# Patient Record
Sex: Female | Born: 1975 | Race: White | Hispanic: No | Marital: Married
Health system: Southern US, Community
[De-identification: ages and names within clinical notes are randomized; demographics above are authoritative.]

---

## 2021-05-11 ENCOUNTER — Emergency Department (HOSPITAL_COMMUNITY): Payer: BLUE CROSS/BLUE SHIELD

## 2021-05-11 ENCOUNTER — Other Ambulatory Visit: Payer: Self-pay

## 2021-05-11 ENCOUNTER — Encounter (HOSPITAL_COMMUNITY): Payer: Self-pay | Admitting: Emergency Medicine

## 2021-05-11 ENCOUNTER — Emergency Department (HOSPITAL_COMMUNITY)
Admission: EM | Admit: 2021-05-11 | Discharge: 2021-05-11 | Disposition: A | Discharge status: Home / Self Care | Payer: BLUE CROSS/BLUE SHIELD | Attending: Emergency Medicine | Admitting: Emergency Medicine

## 2021-05-11 DIAGNOSIS — W19XXXA Unspecified fall, initial encounter: Secondary | ICD-10-CM

## 2021-05-11 DIAGNOSIS — W1812XA Fall from or off toilet with subsequent striking against object, initial encounter: Secondary | ICD-10-CM | POA: Diagnosis not present

## 2021-05-11 DIAGNOSIS — Y908 Blood alcohol level of 240 mg/100 ml or more: Secondary | ICD-10-CM | POA: Insufficient documentation

## 2021-05-11 DIAGNOSIS — R911 Solitary pulmonary nodule: Secondary | ICD-10-CM | POA: Diagnosis not present

## 2021-05-11 DIAGNOSIS — S0990XA Unspecified injury of head, initial encounter: Secondary | ICD-10-CM | POA: Diagnosis not present

## 2021-05-11 DIAGNOSIS — Z79899 Other long term (current) drug therapy: Secondary | ICD-10-CM | POA: Diagnosis not present

## 2021-05-11 LAB — RAPID URINE DRUG SCREEN, HOSP PERFORMED
Amphetamines: NOT DETECTED
Barbiturates: NOT DETECTED
Benzodiazepines: NOT DETECTED
Cocaine: NOT DETECTED
Opiates: NOT DETECTED
Tetrahydrocannabinol: NOT DETECTED

## 2021-05-11 LAB — CBC WITH DIFFERENTIAL/PLATELET
Abs Immature Granulocytes: 0.06 10*3/uL (ref 0.00–0.07)
Basophils Absolute: 0.1 10*3/uL (ref 0.0–0.1)
Basophils Relative: 1 %
Eosinophils Absolute: 0.2 10*3/uL (ref 0.0–0.5)
Eosinophils Relative: 2 %
HCT: 47.1 % — ABNORMAL HIGH (ref 36.0–46.0)
Hemoglobin: 16 g/dL — ABNORMAL HIGH (ref 12.0–15.0)
Immature Granulocytes: 1 %
Lymphocytes Relative: 39 %
Lymphs Abs: 4.1 10*3/uL — ABNORMAL HIGH (ref 0.7–4.0)
MCH: 32.7 pg (ref 26.0–34.0)
MCHC: 34 g/dL (ref 30.0–36.0)
MCV: 96.3 fL (ref 80.0–100.0)
Monocytes Absolute: 0.5 10*3/uL (ref 0.1–1.0)
Monocytes Relative: 5 %
Neutro Abs: 5.6 10*3/uL (ref 1.7–7.7)
Neutrophils Relative %: 52 %
Platelets: 287 10*3/uL (ref 150–400)
RBC: 4.89 MIL/uL (ref 3.87–5.11)
RDW: 12 % (ref 11.5–15.5)
WBC: 10.6 10*3/uL — ABNORMAL HIGH (ref 4.0–10.5)
nRBC: 0 % (ref 0.0–0.2)

## 2021-05-11 LAB — URINALYSIS, ROUTINE W REFLEX MICROSCOPIC
Bilirubin Urine: NEGATIVE
Glucose, UA: NEGATIVE mg/dL
Ketones, ur: NEGATIVE mg/dL
Leukocytes,Ua: NEGATIVE
Nitrite: NEGATIVE
Protein, ur: NEGATIVE mg/dL
Specific Gravity, Urine: 1.005 (ref 1.005–1.030)
pH: 5 (ref 5.0–8.0)

## 2021-05-11 LAB — COMPREHENSIVE METABOLIC PANEL
ALT: 24 U/L (ref 0–44)
AST: 16 U/L (ref 15–41)
Albumin: 4.4 g/dL (ref 3.5–5.0)
Alkaline Phosphatase: 76 U/L (ref 38–126)
Anion gap: 7 (ref 5–15)
BUN: 10 mg/dL (ref 6–20)
CO2: 23 mmol/L (ref 22–32)
Calcium: 9.5 mg/dL (ref 8.9–10.3)
Chloride: 110 mmol/L (ref 98–111)
Creatinine, Ser: 0.78 mg/dL (ref 0.44–1.00)
GFR, Estimated: >60 mL/min (ref >60)
Glucose, Bld: 116 mg/dL — ABNORMAL HIGH (ref 70–99)
Potassium: 4.2 mmol/L (ref 3.5–5.1)
Sodium: 140 mmol/L (ref 135–145)
Total Bilirubin: 0.4 mg/dL (ref 0.3–1.2)
Total Protein: 7.4 g/dL (ref 6.5–8.1)

## 2021-05-11 LAB — PREGNANCY, URINE: Preg Test, Ur: NEGATIVE

## 2021-05-11 LAB — ETHANOL: Alcohol, Ethyl (B): 254 mg/dL — ABNORMAL HIGH (ref <10)

## 2021-05-11 NOTE — Discharge Instructions (Addendum)
1. Medications: usual home medications 2. Treatment: rest, drink plenty of fluids,  3. Follow Up: Please followup with your primary doctor in 2-3 days for discussion of your diagnoses and further evaluation after today's visit; if you do not have a primary care doctor use the resource guide provided to find one; Please return to the ER for new or worsening symptoms  

## 2021-05-11 NOTE — ED Notes (Signed)
Pt lowered herself to lie in the floor.  Pt placed in recliner and instructed to not lie down on the floor.

## 2021-05-11 NOTE — ED Provider Notes (Signed)
[facility] EMERGENCY DEPARTMENT Provider Note   CSN: [id number] Arrival date & time: 05/11/21  0120     History Chief Complaint  Patient presents with   Judith Williamson is a 45 y.o. female presents to the emergency department after syncopal episode while at a Christmas Party. Patient reports she went to the bathroom and when she came out she had a syncopal episode.  Pt states she had 2 glasses of wine at the party and ate a small amount of food.  Per EMS patient fell off the toilet and did hit her head.  They report she was very anxious upon their arrival.  Has been alert and oriented throughout transport.  Patient denies history of CVA or blood thinner usage.  Does have a history of migraine headaches but states today's headache feels different.  Denies vision changes, numbness or tingling.    The history is provided by the patient and medical records. No language interpreter was used.      History reviewed. No pertinent past medical history.  There are no problems to display for this patient.    OB History   No obstetric history on file.     No family history on file.     Home Medications Prior to Admission medications   Not on File    Allergies    Patient has no known allergies.  Review of Systems   Review of Systems  Constitutional:  Negative for appetite change, diaphoresis, fatigue, fever and unexpected weight change.  HENT:  Negative for mouth sores.   Eyes:  Negative for visual disturbance.  Respiratory:  Negative for cough, chest tightness, shortness of breath and wheezing.   Cardiovascular:  Negative for chest pain.  Gastrointestinal:  Negative for abdominal pain, constipation, diarrhea, nausea and vomiting.  Endocrine: Negative for polydipsia, polyphagia and polyuria.  Genitourinary:  Negative for dysuria, frequency, hematuria and urgency.  Musculoskeletal:  Negative for back pain and neck stiffness.  Skin:  Negative for rash.   Allergic/Immunologic: Negative for immunocompromised state.  Neurological:  Positive for syncope and headaches. Negative for light-headedness.  Hematological:  Does not bruise/bleed easily.  Psychiatric/Behavioral:  Negative for sleep disturbance. The patient is not nervous/anxious.    Physical Exam Updated Vital Signs BP (!) 148/97 (BP Location: Left Arm)   Pulse 74   Temp 98 F (36.7 C) (Oral)   Resp 17   SpO2 98%   Physical Exam Vitals and nursing note reviewed.  Constitutional:      General: She is not in acute distress.    Appearance: She is not diaphoretic.  HENT:     Head: Normocephalic.  Eyes:     General: No scleral icterus.    Conjunctiva/sclera: Conjunctivae normal.  Cardiovascular:     Rate and Rhythm: Normal rate and regular rhythm.     Pulses: Normal pulses.          Radial pulses are 2+ on the right side and 2+ on the left side.  Pulmonary:     Effort: No tachypnea, accessory muscle usage, prolonged expiration, respiratory distress or retractions.     Breath sounds: No stridor.     Comments: Equal chest rise. No increased work of breathing. Abdominal:     General: There is no distension.     Palpations: Abdomen is soft.     Tenderness: There is no abdominal tenderness. There is no guarding or rebound.  Musculoskeletal:     Cervical back: Normal  range of motion.     Comments: Moves all extremities equally and without difficulty.  Skin:    General: Skin is warm and dry.     Capillary Refill: Capillary refill takes less than 2 seconds.  Neurological:     Mental Status: She is alert.     GCS: GCS eye subscore is 4. GCS verbal subscore is 5. GCS motor subscore is 6.     Comments: Speech is clear and goal oriented. Pt A&O x4 at this time. Ambulatory with steady gait.   Psychiatric:        Mood and Affect: Mood normal.    ED Results / Procedures / Treatments   Labs (all labs ordered are listed, but only abnormal results are displayed) Labs Reviewed   CBC WITH DIFFERENTIAL/PLATELET - Abnormal; Notable for the following components:      Result Value   WBC 10.6 (*)    Hemoglobin 16.0 (*)    HCT 47.1 (*)    Lymphs Abs 4.1 (*)    All other components within normal limits  COMPREHENSIVE METABOLIC PANEL - Abnormal; Notable for the following components:   Glucose, Bld 116 (*)    All other components within normal limits  URINALYSIS, ROUTINE W REFLEX MICROSCOPIC - Abnormal; Notable for the following components:   Color, Urine STRAW (*)    Hgb urine dipstick SMALL (*)    Bacteria, UA RARE (*)    All other components within normal limits  ETHANOL - Abnormal; Notable for the following components:   Alcohol, Ethyl (B) 254 (*)    All other components within normal limits  RAPID URINE DRUG SCREEN, HOSP PERFORMED  PREGNANCY, URINE    Radiology CT HEAD WO CONTRAST (5MM)  Result Date: 05/11/2021 CLINICAL DATA:  Head trauma, neck trauma. EXAM: CT HEAD WITHOUT CONTRAST CT CERVICAL SPINE WITHOUT CONTRAST TECHNIQUE: Multidetector CT imaging of the head and cervical spine was performed following the standard protocol without intravenous contrast. Multiplanar CT image reconstructions of the cervical spine were also generated. COMPARISON:  None. FINDINGS: CT HEAD FINDINGS Brain: No acute intracranial hemorrhage, midline shift or mass effect. No extra-axial fluid collection. Gray-white matter differentiation is within normal limits and there is no hydrocephalus. Vascular: No hyperdense vessel or unexpected calcification. Skull: Normal. Negative for fracture or focal lesion. Sinuses/Orbits: No acute finding. Other: None. CT CERVICAL SPINE FINDINGS Alignment: Normal. Skull base and vertebrae: No acute fracture. No primary bone lesion or focal pathologic process. Soft tissues and spinal canal: No prevertebral fluid or swelling. No visible canal hematoma. Disc levels: No significant intervertebral disc space narrowing. No significant spinal canal or neural  foraminal stenosis. Upper chest: A 7 mm nodular opacity is present in the right upper lobe, axial image 94. Other: None. IMPRESSION: 1. No acute intracranial process. 2. No fracture or subluxation in the cervical spine. 3. Right upper lobe pulmonary nodule. Non-contrast chest CT at 6-12 months is recommended. If the nodule is stable at time of repeat CT, then future CT at 18-24 months (from today's scan) is considered optional for low-risk patients, but is recommended for high-risk patients. This recommendation follows the consensus statement: Guidelines for Management of Incidental Pulmonary Nodules Detected on CT Images: From the Fleischner Society 2017; Radiology 2017; 284:228-243. Electronically Signed   By: Brett Fairy M.D.   On: 05/11/2021 03:12   CT Cervical Spine Wo Contrast  Result Date: 05/11/2021 CLINICAL DATA:  Head trauma, neck trauma. EXAM: CT HEAD WITHOUT CONTRAST CT CERVICAL SPINE WITHOUT CONTRAST TECHNIQUE:  Multidetector CT imaging of the head and cervical spine was performed following the standard protocol without intravenous contrast. Multiplanar CT image reconstructions of the cervical spine were also generated. COMPARISON:  None. FINDINGS: CT HEAD FINDINGS Brain: No acute intracranial hemorrhage, midline shift or mass effect. No extra-axial fluid collection. Gray-white matter differentiation is within normal limits and there is no hydrocephalus. Vascular: No hyperdense vessel or unexpected calcification. Skull: Normal. Negative for fracture or focal lesion. Sinuses/Orbits: No acute finding. Other: None. CT CERVICAL SPINE FINDINGS Alignment: Normal. Skull base and vertebrae: No acute fracture. No primary bone lesion or focal pathologic process. Soft tissues and spinal canal: No prevertebral fluid or swelling. No visible canal hematoma. Disc levels: No significant intervertebral disc space narrowing. No significant spinal canal or neural foraminal stenosis. Upper chest: A 7 mm nodular  opacity is present in the right upper lobe, axial image 94. Other: None. IMPRESSION: 1. No acute intracranial process. 2. No fracture or subluxation in the cervical spine. 3. Right upper lobe pulmonary nodule. Non-contrast chest CT at 6-12 months is recommended. If the nodule is stable at time of repeat CT, then future CT at 18-24 months (from today's scan) is considered optional for low-risk patients, but is recommended for high-risk patients. This recommendation follows the consensus statement: Guidelines for Management of Incidental Pulmonary Nodules Detected on CT Images: From the Fleischner Society 2017; Radiology 2017; 284:228-243. Electronically Signed   By: Brett Fairy M.D.   On: 05/11/2021 03:12    Procedures Procedures   Medications Ordered in ED Medications - No data to display  ED Course  I have reviewed the triage vital signs and the nursing notes.  Pertinent labs & imaging results that were available during my care of the patient were reviewed by me and considered in my medical decision making (see chart for details).    MDM Rules/Calculators/A&P                           Pt presents via EMS after syncope.  EtOH on board.  Pt with unsteady gait in the ED.  C/o severe headache, but grossly normal neuro exam at triage.  Work-up ordered.   CT head/neck without traumatic injury or ICH.  Labs reassuring.  Pt with EtOH level >250.  Will allow patient to metabolize.   5:41 AM Pt awake and alert.  Oriented x4. She is ambulatory with steady gait and wishes to go home.  Reports her headache is improved, but not resolved.  No vomiting here in the ED.  BP has improved.   BP 113/87 (BP Location: Right Arm)   Pulse (!) 110   Temp 98.9 F (37.2 C) (Oral)   Resp 17   SpO2 97%   Discussed findings with patient including lung nodule and need for close f/u. Pt states understanding and is in agreement with the plan.    Final Clinical Impression(s) / ED Diagnoses Final diagnoses:   Pulmonary nodule  Blood alcohol level of 240 mg/100 ml or more  Fall, initial encounter    Rx / DC Orders ED Discharge Orders     None        Ginna Schuur, Gwenlyn Perking 05/11/21 [place], MD 05/11/21 [phone]

## 2021-05-11 NOTE — ED Notes (Signed)
Pt ambulatory in triage area, only c/o HA.

## 2021-05-11 NOTE — ED Provider Notes (Signed)
Emergency Medicine Provider Triage Evaluation Note  Judith Williamson , a 45 y.o. female  was evaluated in triage.  Pt complains of to be while at a Christmas party.  Patient reports she went to the bathroom and when she came out she had a syncopal episode.  Per EMS patient fell off the toilet and did hit her head.  They report she was very anxious upon their arrival.  Has been alert and oriented throughout transport.  Patient denies history of CVA or blood thinner usage.  Does have a history of migraine headaches but states today's headache feels different.  Denies vision changes, numbness or tingling.  Review of Systems  Positive: Headache, syncope Negative: Numbness, tingling, weakness, vision changes  Physical Exam  BP (!) 148/97 (BP Location: Left Arm)   Pulse 74   Temp 98 F (36.7 C) (Oral)   Resp 17   SpO2 98%  Gen:   Awake, no distress, tearful Resp:  Normal effort  MSK:   Moves extremities without difficulty  Other:  Ataxic gait  Medical Decision Making  Medically screening exam initiated at 1:43 AM.  Appropriate orders placed.  Judith Williamson was informed that the remainder of the evaluation will be completed by another provider, this initial triage assessment does not replace that evaluation, and the importance of remaining in the ED until their evaluation is complete.  EtOH, syncope, head injury.  Work-up pending.   Judith Williamson, Judith Williamson 05/11/21 0143    Judith Conn, MD 05/11/21 [phone]

## 2021-05-11 NOTE — ED Triage Notes (Signed)
Per EMS pt was at a Christmas party and "passed out" hitting her head.  Pt tearful at time of triage.

## 2022-12-07 IMAGING — CT CT CERVICAL SPINE W/O CM
4 series · 15 of 35 positions shown, 18 images · non-contrast
Comparison: None.

CLINICAL DATA: Head trauma, neck trauma.

EXAM:
CT HEAD WITHOUT CONTRAST
CT CERVICAL SPINE WITHOUT CONTRAST
TECHNIQUE: Multidetector CT imaging of the head and cervical spine was
performed following the standard protocol without intravenous
contrast. Multiplanar CT image reconstructions of the cervical spine
were also generated.

[Series 4: orthogonal axials · axial · 0.21mm/px · z∈[-279,-171]mm · 5 of 98 slices shown, 7 images]
[im 17/98  soft-tissue]
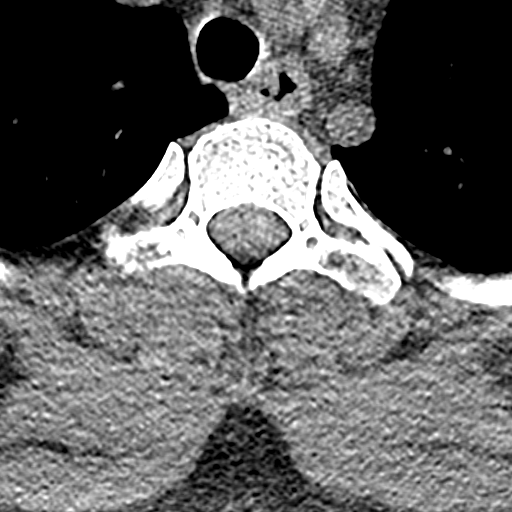
[im 17/98  bone]
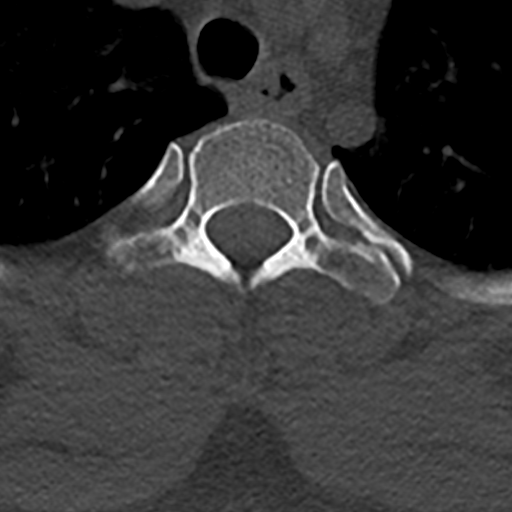
[im 33/98  bone]
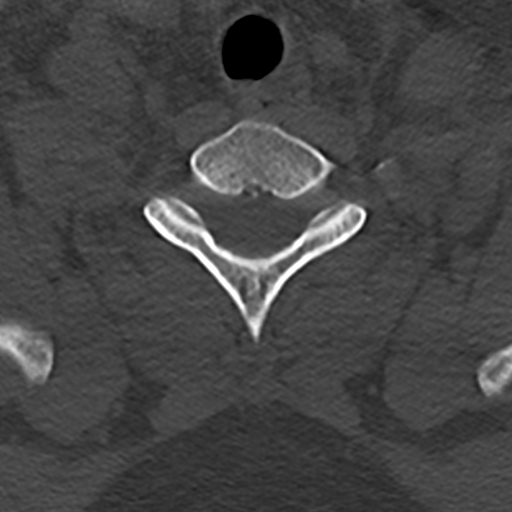
[im 49/98  bone]
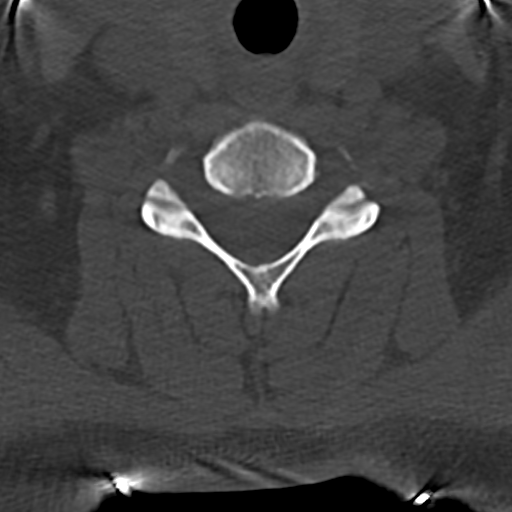
[im 65/98  bone]
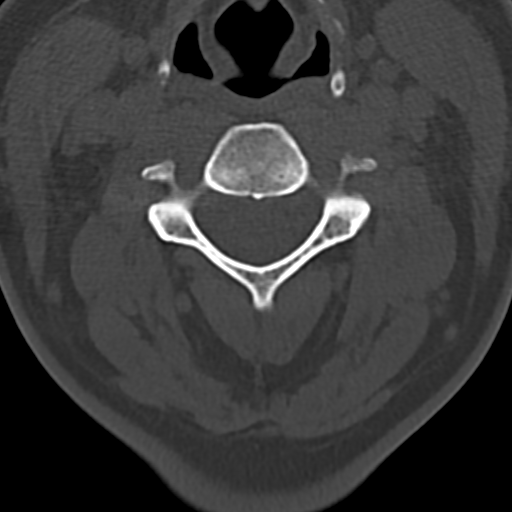
[im 81/98  soft-tissue]
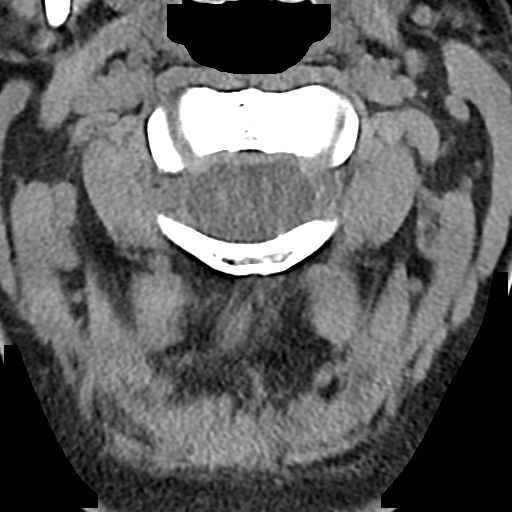
[im 81/98  bone]
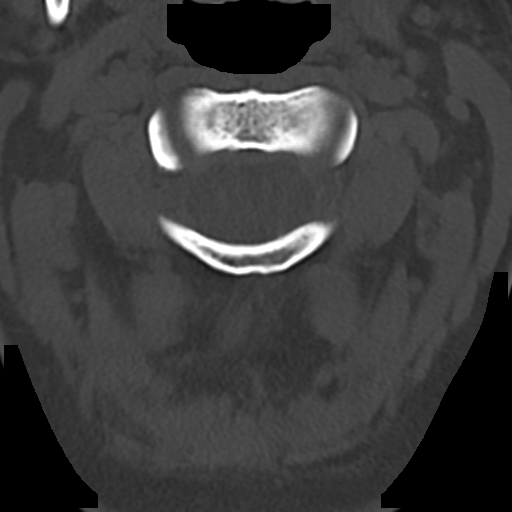

[Series 6: c spine soft · axial · 0.39mm/px · z∈[-268,-236]mm · 2 of 98 slices shown]
[im 17/98  soft-tissue]
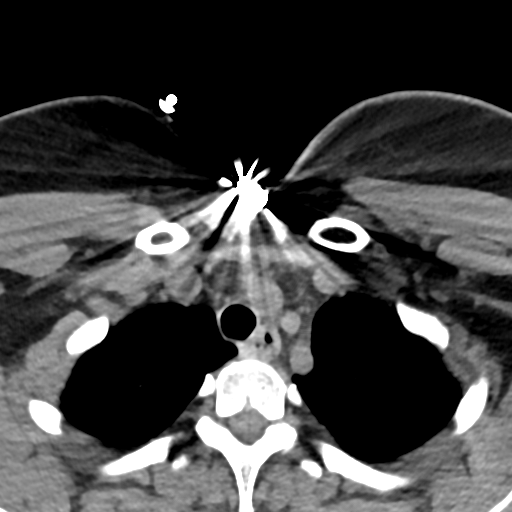
[im 33/98  soft-tissue]
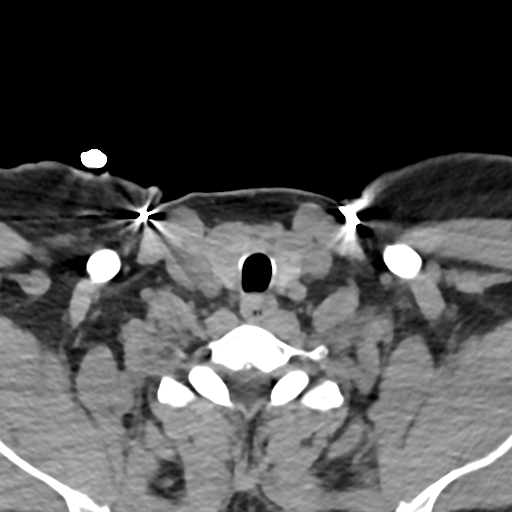

[Series 9: sag bone · sagittal · 0.34mm/px · 5 of 226 slices shown, 6 images]
[im 76/226  bone]
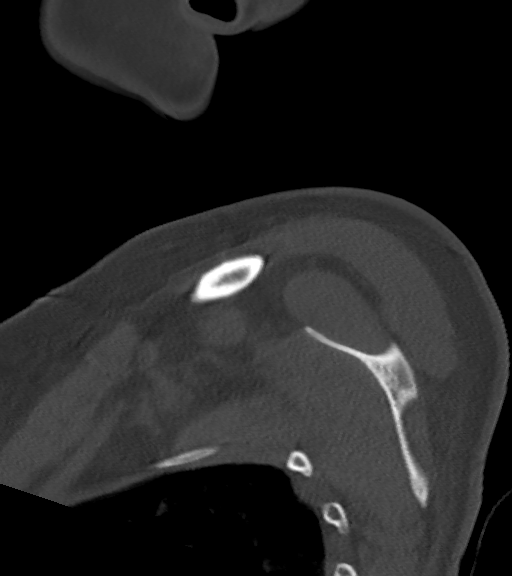
[im 94/226  bone]
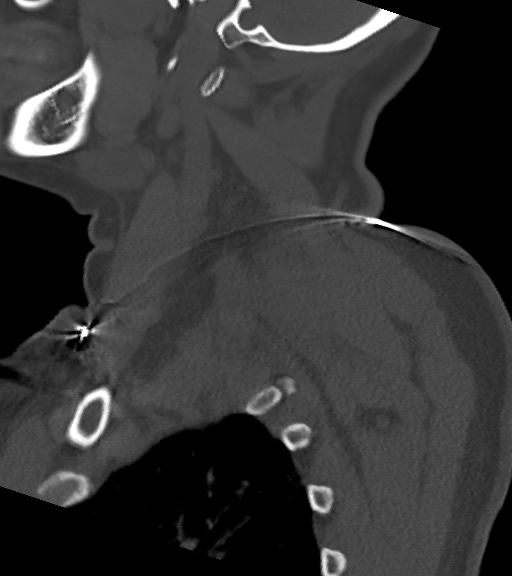
[im 113/226  soft-tissue]
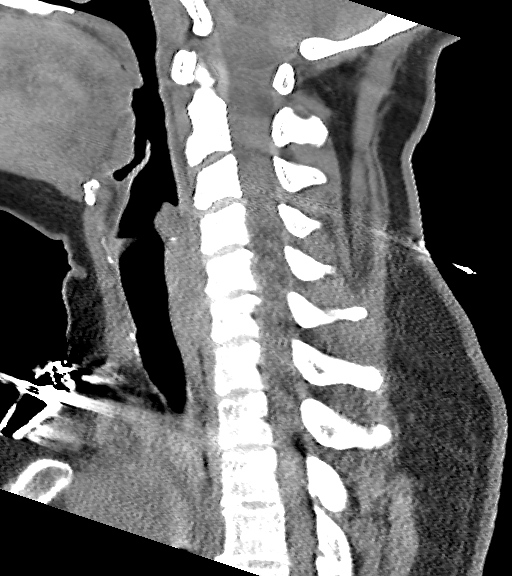
[im 113/226  bone]
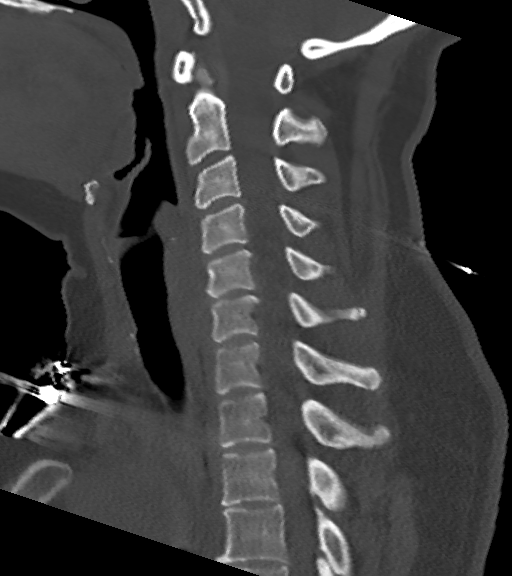
[im 132/226  bone]
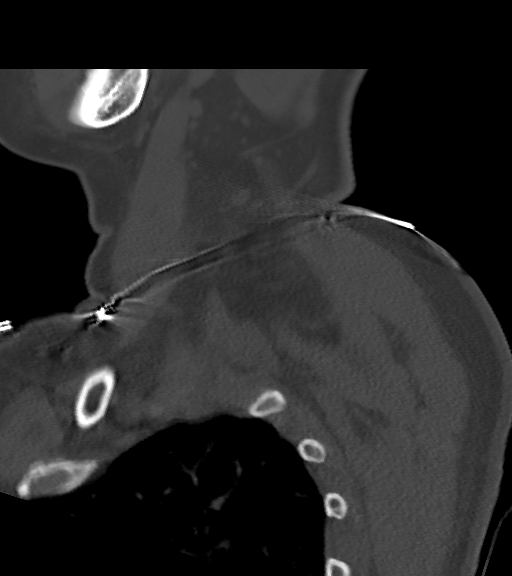
[im 151/226  bone]
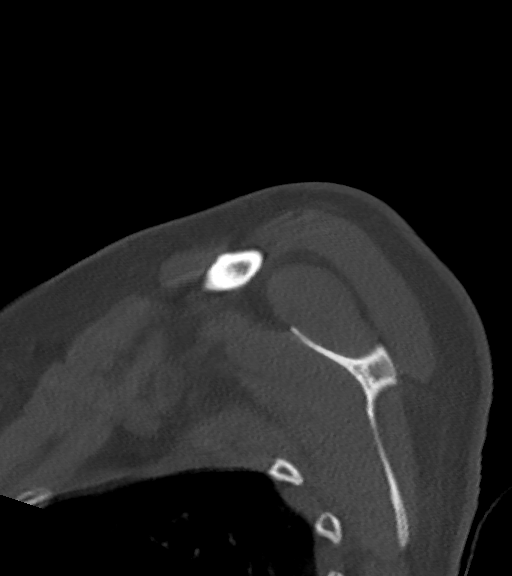

[Series 10: cor bone · coronal · 0.34mm/px · 3 of 129 slices shown]
[im 42/129  bone]
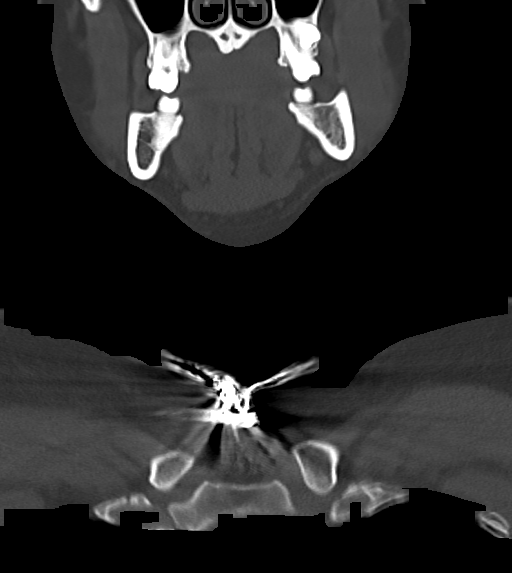
[im 57/129  bone]
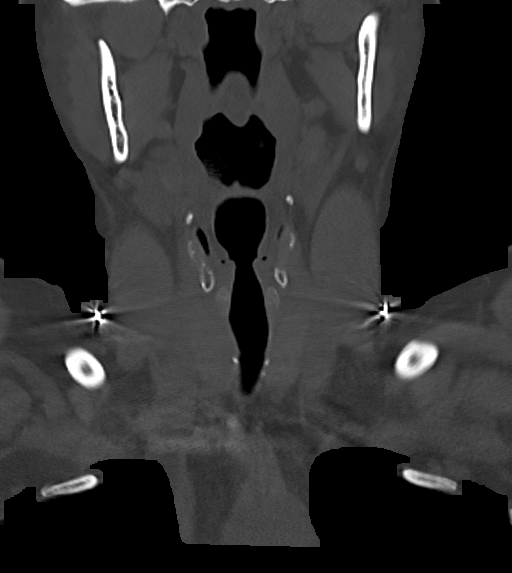
[im 72/129  bone]
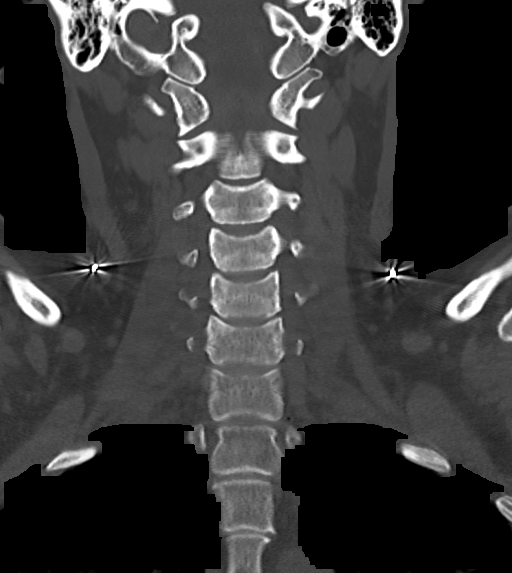

[15 of 35 positions shown; findings below may reference images not displayed]

FINDINGS: CT HEAD FINDINGS

Brain: No acute intracranial hemorrhage, midline shift or mass
effect. No extra-axial fluid collection. Gray-white matter
differentiation is within normal limits and there is no
hydrocephalus.

Vascular: No hyperdense vessel or unexpected calcification.

Skull: Normal. Negative for fracture or focal lesion.

Sinuses/Orbits: No acute finding.

Other: None.

CT CERVICAL SPINE FINDINGS

Alignment: Normal.

Skull base and vertebrae: No acute fracture. No primary bone lesion
or focal pathologic process.

Soft tissues and spinal canal: No prevertebral fluid or swelling. No
visible canal hematoma.

Disc levels: No significant intervertebral disc space narrowing. No
significant spinal canal or neural foraminal stenosis.

Upper chest: A 7 mm nodular opacity is present in the right upper
lobe, axial image 94.

Other: None.
IMPRESSION: 1. No acute intracranial process.
2. No fracture or subluxation in the cervical spine.
3. Right upper lobe pulmonary nodule. Non-contrast chest CT at 6-12
months is recommended. If the nodule is stable at time of repeat CT,
then future CT at 18-24 months (from today's scan) is considered
optional for low-risk patients, but is recommended for high-risk
patients. This recommendation follows the consensus statement:
Guidelines for Management of Incidental Pulmonary Nodules Detected

## 2022-12-07 IMAGING — CT CT HEAD W/O CM
4 series · 15 of 47 positions shown, 17 images · non-contrast
Comparison: None.

CLINICAL DATA: Head trauma, neck trauma.

EXAM:
CT HEAD WITHOUT CONTRAST
CT CERVICAL SPINE WITHOUT CONTRAST
TECHNIQUE: Multidetector CT imaging of the head and cervical spine was
performed following the standard protocol without intravenous
contrast. Multiplanar CT image reconstructions of the cervical spine
were also generated.

[Series 3: head wo · axial · 0.43mm/px · z∈[-120,+0]mm · 7 of 32 slices shown, 9 images]
[im 4/32  brain]
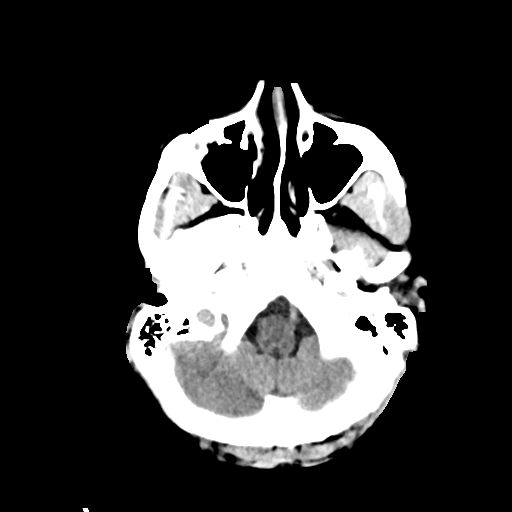
[im 4/32  bone]
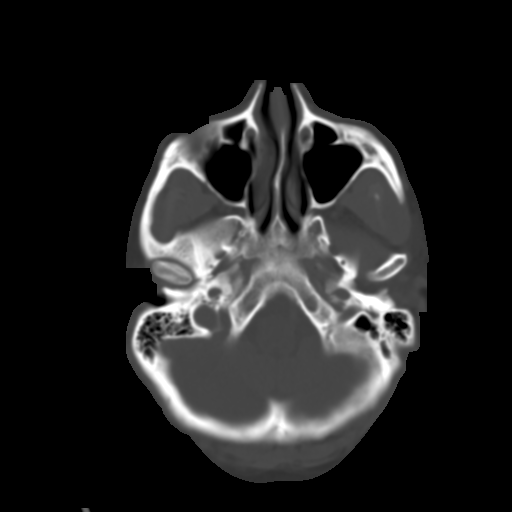
[im 8/32  brain]
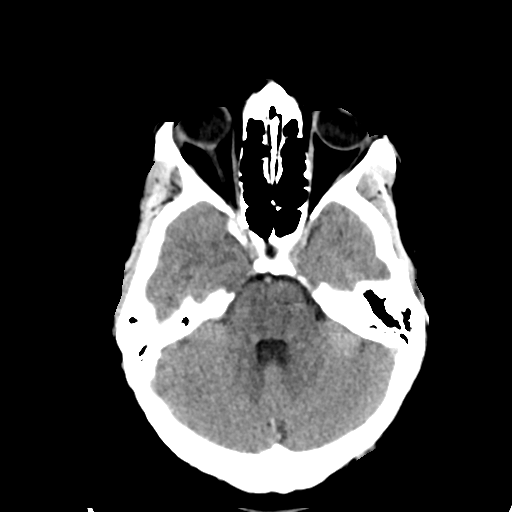
[im 12/32  brain]
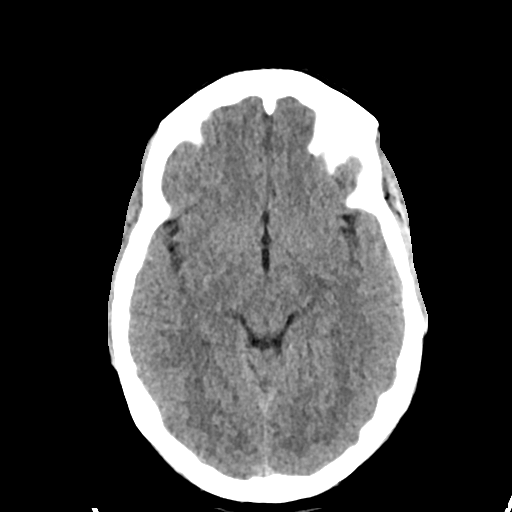
[im 16/32  brain]
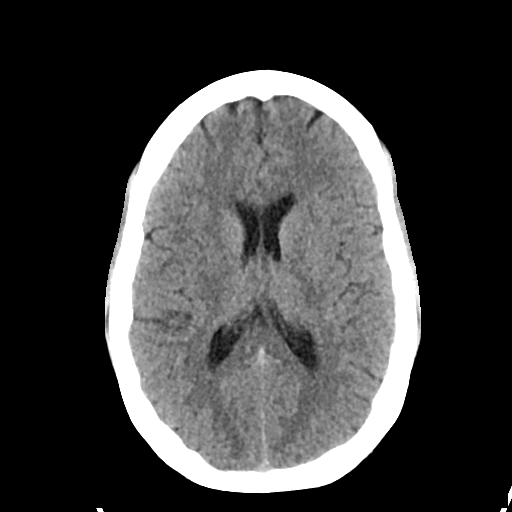
[im 20/32  brain]
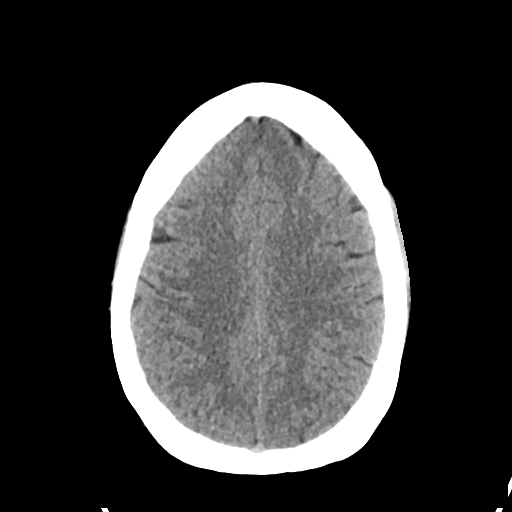
[im 20/32  bone]
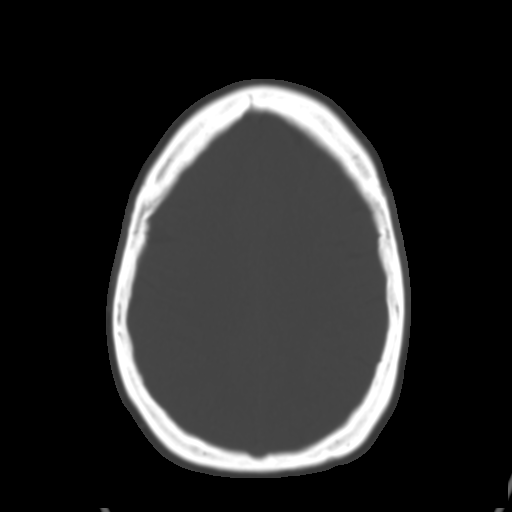
[im 24/32  brain]
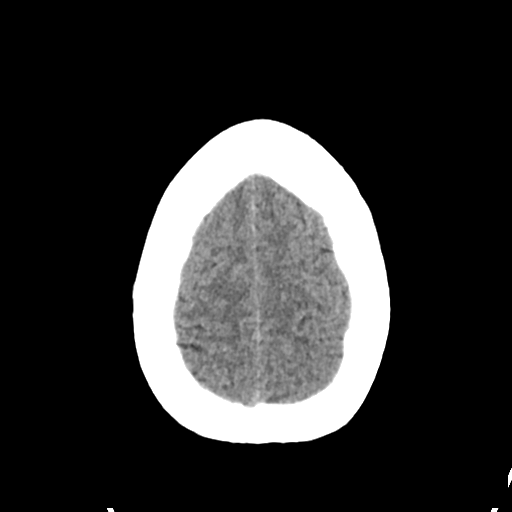
[im 28/32  brain]
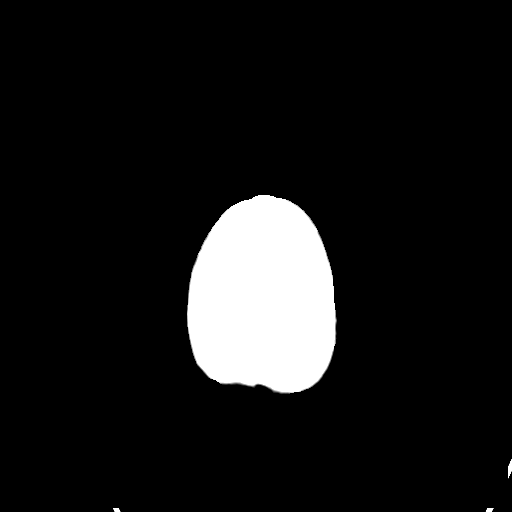

[Series 4: head bone · axial · 0.43mm/px · z∈[-120,-104]mm · 2 of 79 slices shown]
[im 8/79  bone]
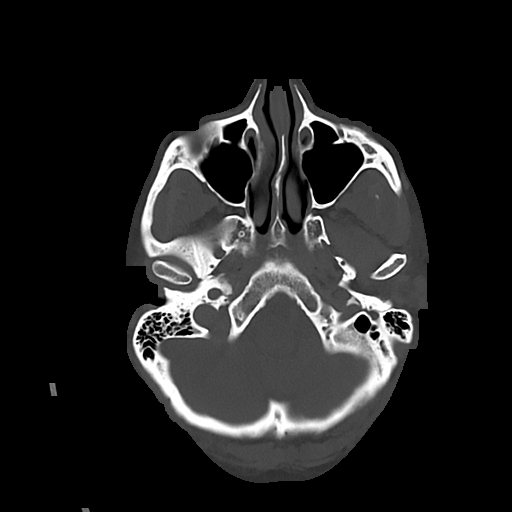
[im 16/79  bone]
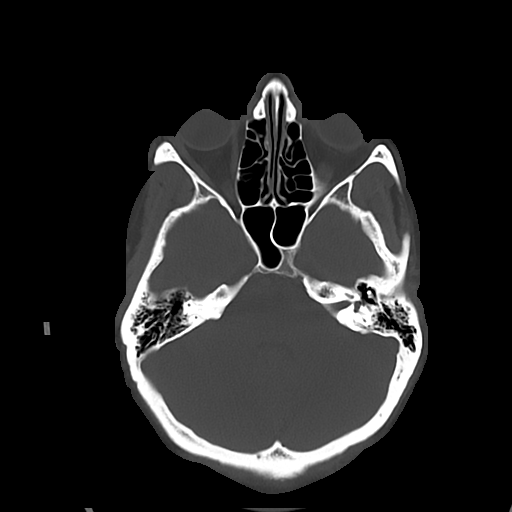

[Series 5: cor soft · coronal · 0.29mm/px · 3 of 68 slices shown]
[im 23/68  brain]
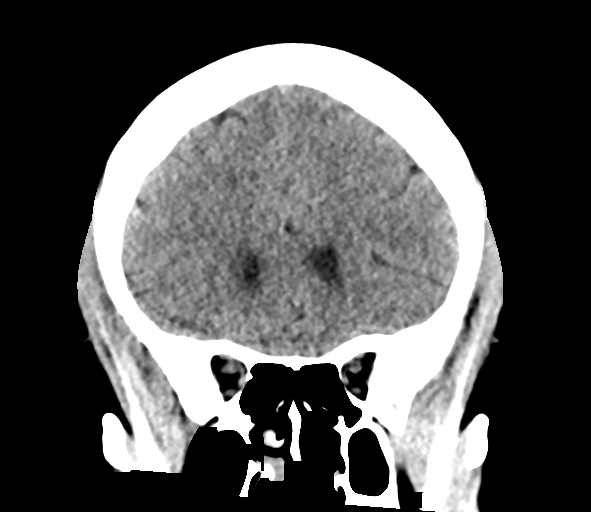
[im 30/68  brain]
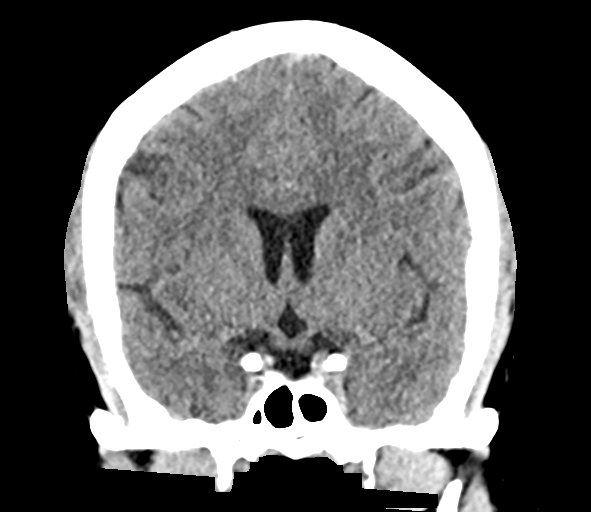
[im 38/68  brain]
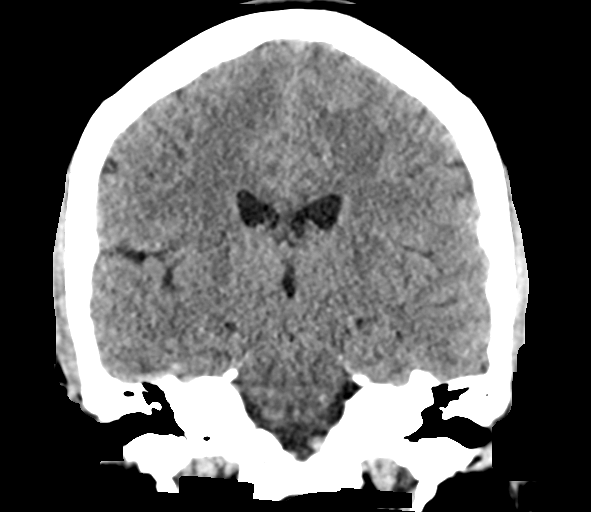

[Series 6: sag soft · sagittal · 0.29mm/px · 3 of 58 slices shown]
[im 20/58  brain]
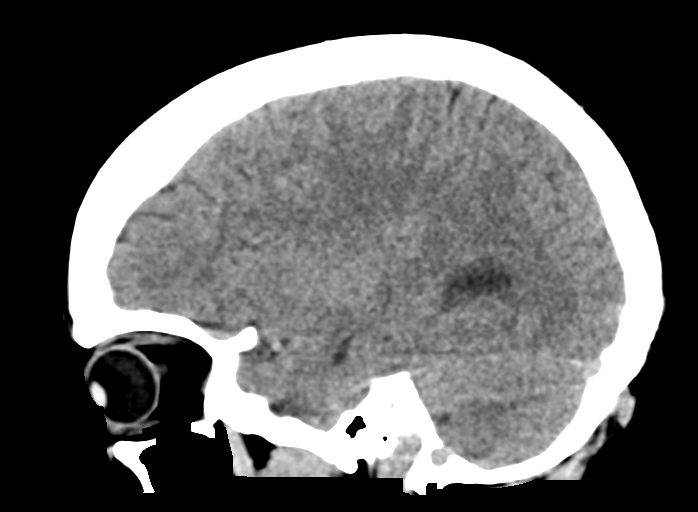
[im 29/58  brain]
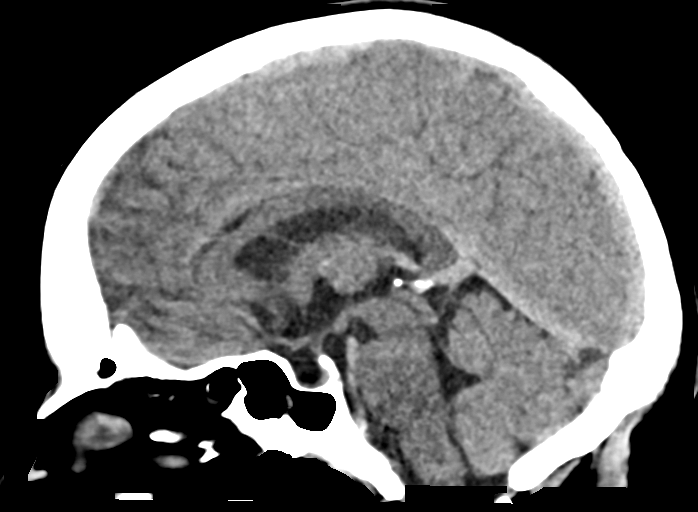
[im 39/58  brain]
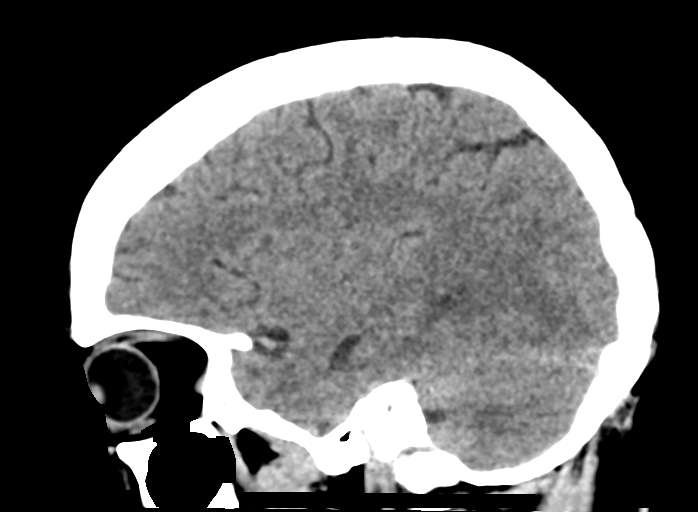

[15 of 47 positions shown; findings below may reference images not displayed]

FINDINGS: CT HEAD FINDINGS

Brain: No acute intracranial hemorrhage, midline shift or mass
effect. No extra-axial fluid collection. Gray-white matter
differentiation is within normal limits and there is no
hydrocephalus.

Vascular: No hyperdense vessel or unexpected calcification.

Skull: Normal. Negative for fracture or focal lesion.

Sinuses/Orbits: No acute finding.

Other: None.

CT CERVICAL SPINE FINDINGS

Alignment: Normal.

Skull base and vertebrae: No acute fracture. No primary bone lesion
or focal pathologic process.

Soft tissues and spinal canal: No prevertebral fluid or swelling. No
visible canal hematoma.

Disc levels: No significant intervertebral disc space narrowing. No
significant spinal canal or neural foraminal stenosis.

Upper chest: A 7 mm nodular opacity is present in the right upper
lobe, axial image 94.

Other: None.
IMPRESSION: 1. No acute intracranial process.
2. No fracture or subluxation in the cervical spine.
3. Right upper lobe pulmonary nodule. Non-contrast chest CT at 6-12
months is recommended. If the nodule is stable at time of repeat CT,
then future CT at 18-24 months (from today's scan) is considered
optional for low-risk patients, but is recommended for high-risk
patients. This recommendation follows the consensus statement:
Guidelines for Management of Incidental Pulmonary Nodules Detected

## 2024-06-16 ENCOUNTER — Other Ambulatory Visit: Payer: Self-pay | Admitting: Medical Genetics

## 2024-07-19 ENCOUNTER — Other Ambulatory Visit (HOSPITAL_COMMUNITY): Payer: Self-pay
